# Patient Record
Sex: Female | Born: 1965 | Race: Black or African American | Hispanic: No | Marital: Married | Smoking: Former smoker
Health system: Southern US, Community
[De-identification: ages and names within clinical notes are randomized; demographics above are authoritative.]

## PROBLEM LIST (undated history)

## (undated) DIAGNOSIS — E669 Obesity, unspecified: Secondary | ICD-10-CM

## (undated) DIAGNOSIS — M1990 Unspecified osteoarthritis, unspecified site: Secondary | ICD-10-CM

## (undated) DIAGNOSIS — I10 Essential (primary) hypertension: Secondary | ICD-10-CM

## (undated) HISTORY — PX: UTERINE FIBROID SURGERY: SHX826

## (undated) HISTORY — PX: TUBAL LIGATION: SHX77

---

## 2008-07-01 ENCOUNTER — Emergency Department (HOSPITAL_COMMUNITY): Admission: EM | Admit: 2008-07-01 | Discharge: 2008-07-01 | Payer: Self-pay | Admitting: Emergency Medicine

## 2009-05-20 ENCOUNTER — Emergency Department (HOSPITAL_COMMUNITY): Admission: EM | Admit: 2009-05-20 | Discharge: 2009-05-20 | Payer: Self-pay | Admitting: Emergency Medicine

## 2009-07-04 ENCOUNTER — Emergency Department (HOSPITAL_COMMUNITY): Admission: EM | Admit: 2009-07-04 | Discharge: 2009-07-04 | Payer: Self-pay | Admitting: Emergency Medicine

## 2010-12-25 ENCOUNTER — Emergency Department (HOSPITAL_BASED_OUTPATIENT_CLINIC_OR_DEPARTMENT_OTHER)
Admission: EM | Admit: 2010-12-25 | Discharge: 2010-12-25 | Disposition: A | Discharge status: Home / Self Care | Payer: Self-pay | Attending: Emergency Medicine | Admitting: Emergency Medicine

## 2010-12-25 ENCOUNTER — Encounter: Payer: Self-pay | Admitting: *Deleted

## 2010-12-25 ENCOUNTER — Emergency Department (INDEPENDENT_AMBULATORY_CARE_PROVIDER_SITE_OTHER): Payer: Self-pay

## 2010-12-25 DIAGNOSIS — R0602 Shortness of breath: Secondary | ICD-10-CM

## 2010-12-25 DIAGNOSIS — R05 Cough: Secondary | ICD-10-CM

## 2010-12-25 DIAGNOSIS — R079 Chest pain, unspecified: Secondary | ICD-10-CM

## 2010-12-25 DIAGNOSIS — R059 Cough, unspecified: Secondary | ICD-10-CM | POA: Insufficient documentation

## 2010-12-25 HISTORY — DX: Essential (primary) hypertension: I10

## 2010-12-25 MED ORDER — HYDROCOD POLST-CHLORPHEN POLST 10-8 MG/5ML PO LQCR: 5.0000 mL | Freq: Two times a day (BID) | ORAL | Status: DC

## 2010-12-25 MED ORDER — AZITHROMYCIN 250 MG PO TABS: 250.0000 mg | ORAL_TABLET | Freq: Every day | ORAL | Status: AC

## 2010-12-25 NOTE — ED Provider Notes (Signed)
History     Chief Complaint  Patient presents with  . Shortness of Breath   Patient is a 45 y.o. female presenting with shortness of breath. The history is provided by the patient. No language interpreter was used.  Shortness of Breath  The current episode started more than 2 weeks ago. The onset was gradual. The problem occurs continuously. The problem has been gradually worsening. The problem is mild. The symptoms are relieved by nothing. The symptoms are aggravated by nothing. Associated symptoms include a fever, cough and shortness of breath. Pertinent negatives include no chest pain and no sore throat. She was not exposed to toxic fumes. She has had no prior steroid use. Her past medical history does not include asthma.    History reviewed. No pertinent past medical history.  Past Surgical History  Procedure Date  . Cesarean section   . Uterine fibroid surgery     History reviewed. No pertinent family history.  History  Substance Use Topics  . Smoking status: Former Games developer  . Smokeless tobacco: Not on file  . Alcohol Use: No    OB History    Grav Para Term Preterm Abortions TAB SAB Ect Mult Living                  Review of Systems  Constitutional: Positive for fever.       Pt states that she was started on a bp medication that was making her cough:pt states that she hasn't been on that medication or any bp medication even though she was given an new script for hctz  HENT: Negative for sore throat.   Respiratory: Positive for cough and shortness of breath.   Cardiovascular: Negative for chest pain.    Physical Exam  BP 149/77  Pulse 78  Temp(Src) 98.7 F (37.1 C) (Oral)  Resp 18  SpO2 97%  Physical Exam  Constitutional: She appears well-developed and well-nourished.  HENT:  Head: Normocephalic.  Neck: Normal range of motion. Neck supple.  Cardiovascular: Normal rate and regular rhythm.   Pulmonary/Chest: No accessory muscle usage. No apnea. No respiratory  distress.       Pt has a productive cough  Abdominal: Soft.  Musculoskeletal: Normal range of motion.  Neurological: She is alert.  Skin: Skin is warm.  Psychiatric: She has a normal mood and affect.    ED Course  Procedures  MDM Pt cough not related to medication as pt hasn't been on them in 3 weeks:pt is not having cp:pt has had fever and productive cough:will treat for bronchitis do to persistent symptoms:no edema noted on the exam      Teressa Lower, NP 12/25/10 1416  Evaluation and management were performed by NP/PA under my supervision/collaboration.  SHELDON,CHARLES B.   Charles B. Bernette Mayers, MD 12/25/10 1430

## 2010-12-25 NOTE — ED Notes (Signed)
Cough and sob for about 6 weeks. Was started on BP medication prior to cough.

## 2011-10-20 ENCOUNTER — Encounter (HOSPITAL_COMMUNITY): Payer: Self-pay | Admitting: *Deleted

## 2011-10-20 ENCOUNTER — Emergency Department (HOSPITAL_COMMUNITY)
Admission: EM | Admit: 2011-10-20 | Discharge: 2011-10-20 | Disposition: A | Discharge status: Home / Self Care | Payer: Self-pay | Source: Home / Self Care | Attending: Family Medicine | Admitting: Family Medicine

## 2011-10-20 DIAGNOSIS — I10 Essential (primary) hypertension: Secondary | ICD-10-CM

## 2011-10-20 LAB — POCT I-STAT, CHEM 8
Calcium, Ion: 1.21 mmol/L (ref 1.12–1.32)
Creatinine, Ser: 0.9 mg/dL (ref 0.50–1.10)
TCO2: 28 mmol/L (ref 0–100)

## 2011-10-20 MED ORDER — LISINOPRIL-HYDROCHLOROTHIAZIDE 20-12.5 MG PO TABS: 1.0000 | ORAL_TABLET | Freq: Every day | ORAL | Status: DC

## 2011-10-20 MED ORDER — AMLODIPINE BESYLATE 5 MG PO TABS: 5.0000 mg | ORAL_TABLET | Freq: Every day | ORAL | Status: DC

## 2011-10-20 NOTE — Discharge Instructions (Signed)
Go to www.goodrx.com to look up your medications. This will give you a list of where you can find your prescriptions at the most affordable prices.   RESOURCE GUIDE  Chronic Pain Problems Contact [facility]  [phone] Patients need to be referred by their primary care doctor.  Insufficient Money for Medicine Contact [employer]:  call "211" or [facility] [phone].  No Primary Care Doctor Call [facility]  [phone] Other agencies that provide inexpensive medical care    [facility]  [phone]    [facility]  [phone]    [facility]  [phone]    [facility]  [phone] [address] [place] [address]    [facility]  [phone]    [facility]  [phone] [facility] [phone]   [address] [place]. [address] [place], [place] [address]  [facility]  [phone] [facility]  [phone] [facility] Mental Health   [phone] (emergency services [phone])  Substance Abuse [facility]  [phone] [facility] [phone] [facility] [phone] Floydene Flock [phone] Residential & Outpatient Substance Abuse Program  [phone]  Abuse/Neglect [facility] Child Abuse Hotline [phone] [facility] Child Abuse Hotline 8471467610 (After Hours)  Emergency Shelter [facility] [phone]  M[facility] [place] [facility] [phone] [facility] [phone]  MRSA Hotline #:   7541949718                                                Cristobal Goldmann Phone:  [phone]                                   Phone:  [phone]                 Phone:  [phone]  [facility] Phone:  [phone]  [facility] Child Abuse Hotline [phone]    [facility] Resources  [facility]     [employer]                          [facility] [address] [place]                       [address]      [address] [place] [address]   [phone] (After Hours)

## 2011-10-20 NOTE — ED Provider Notes (Signed)
History     CSN: [id number]  Arrival date & time 10/20/11  4098   First MD Initiated Contact with Patient 10/20/11 [phone]      Chief Complaint  Patient presents with  . Dizziness  . Headache    (Consider location/radiation/quality/duration/timing/severity/associated sxs/prior treatment) Patient is a 46 y.o. female presenting with neurologic complaint. The history is provided by the patient.  Neurologic Problem The primary symptoms include headaches and dizziness. The symptoms began 3 to 5 days ago. The symptoms are unchanged.  The headache is not associated with neck stiffness.  Dizziness does not occur with tinnitus.  Additional symptoms do not include neck stiffness, pain, nystagmus, tinnitus or vertigo. Medical issues also include hypertension. Associated medical issues comments: no meds for 7mos..    Past Medical History  Diagnosis Date  . Hypertension     Past Surgical History  Procedure Date  . Cesarean section   . Uterine fibroid surgery     History reviewed. No pertinent family history.  History  Substance Use Topics  . Smoking status: Former Games developer  . Smokeless tobacco: Not on file  . Alcohol Use: No    OB History    Grav Para Term Preterm Abortions TAB SAB Ect Mult Living                  Review of Systems  Constitutional: Negative.   HENT: Negative for congestion, rhinorrhea, neck stiffness, postnasal drip and tinnitus.   Respiratory: Negative for cough, chest tightness and shortness of breath.   Cardiovascular: Negative for chest pain, palpitations and leg swelling.  Neurological: Positive for dizziness and headaches. Negative for vertigo and syncope.    Allergies  Review of patient's allergies indicates no known allergies.  Home Medications   Current Outpatient Rx  Name Route Sig Dispense Refill  . AMLODIPINE BESYLATE 5 MG PO TABS Oral Take 1 tablet (5 mg total) by mouth daily. 30 tablet 1  . HYDROCOD POLST-CPM POLST ER 10-8 MG/5ML PO LQCR  Oral Take 5 mLs by mouth every 12 (twelve) hours. 140 mL 0  . LISINOPRIL-HYDROCHLOROTHIAZIDE 20-12.5 MG PO TABS Oral Take 1 tablet by mouth daily. 30 tablet 1    BP 184/77  Pulse 88  Temp(Src) 98.4 F (36.9 C) (Oral)  Resp 17  SpO2 96%  Physical Exam  Nursing note and vitals reviewed. Constitutional: She is oriented to person, place, and time. She appears well-developed and well-nourished.  HENT:  Head: Normocephalic.  Right Ear: Tympanic membrane, external ear and ear canal normal.  Left Ear: Tympanic membrane, external ear and ear canal normal.  Mouth/Throat: Oropharynx is clear and moist.  Eyes: Conjunctivae and EOM are normal. Pupils are equal, round, and reactive to light.  Neck: Normal range of motion. Neck supple.  Cardiovascular: Normal rate, regular rhythm, normal heart sounds and intact distal pulses.   Pulmonary/Chest: Effort normal and breath sounds normal.  Lymphadenopathy:    She has no cervical adenopathy.  Neurological: She is alert and oriented to person, place, and time.  Skin: Skin is warm and dry.    ED Course  Procedures (including critical care time)  Labs Reviewed  POCT I-STAT, CHEM 8 - Abnormal; Notable for the following:    Glucose, Bld 132 (*)    All other components within normal limits   No results found.   1. Hypertension, isolated systolic       MDM          Linna Hoff, MD 10/20/11 1101

## 2011-10-20 NOTE — ED Notes (Addendum)
Pt with c/o intermittent headache and dizziness x one week - per pt ran out of hypertension medication x months - room spinning when dizziness occurs - unknown hypertension meds - denies chest pain or weakness

## 2011-11-18 IMAGING — CR DG CHEST 2V
2 series · 2 of 2 positions shown · non-contrast
Comparison: Thoracic spine radiographs 07/04/2009.

CLINICAL DATA: Cough, shortness of breath and chest pain.  History
of hypertension.

CHEST - 2 VIEW

[w chest pa]
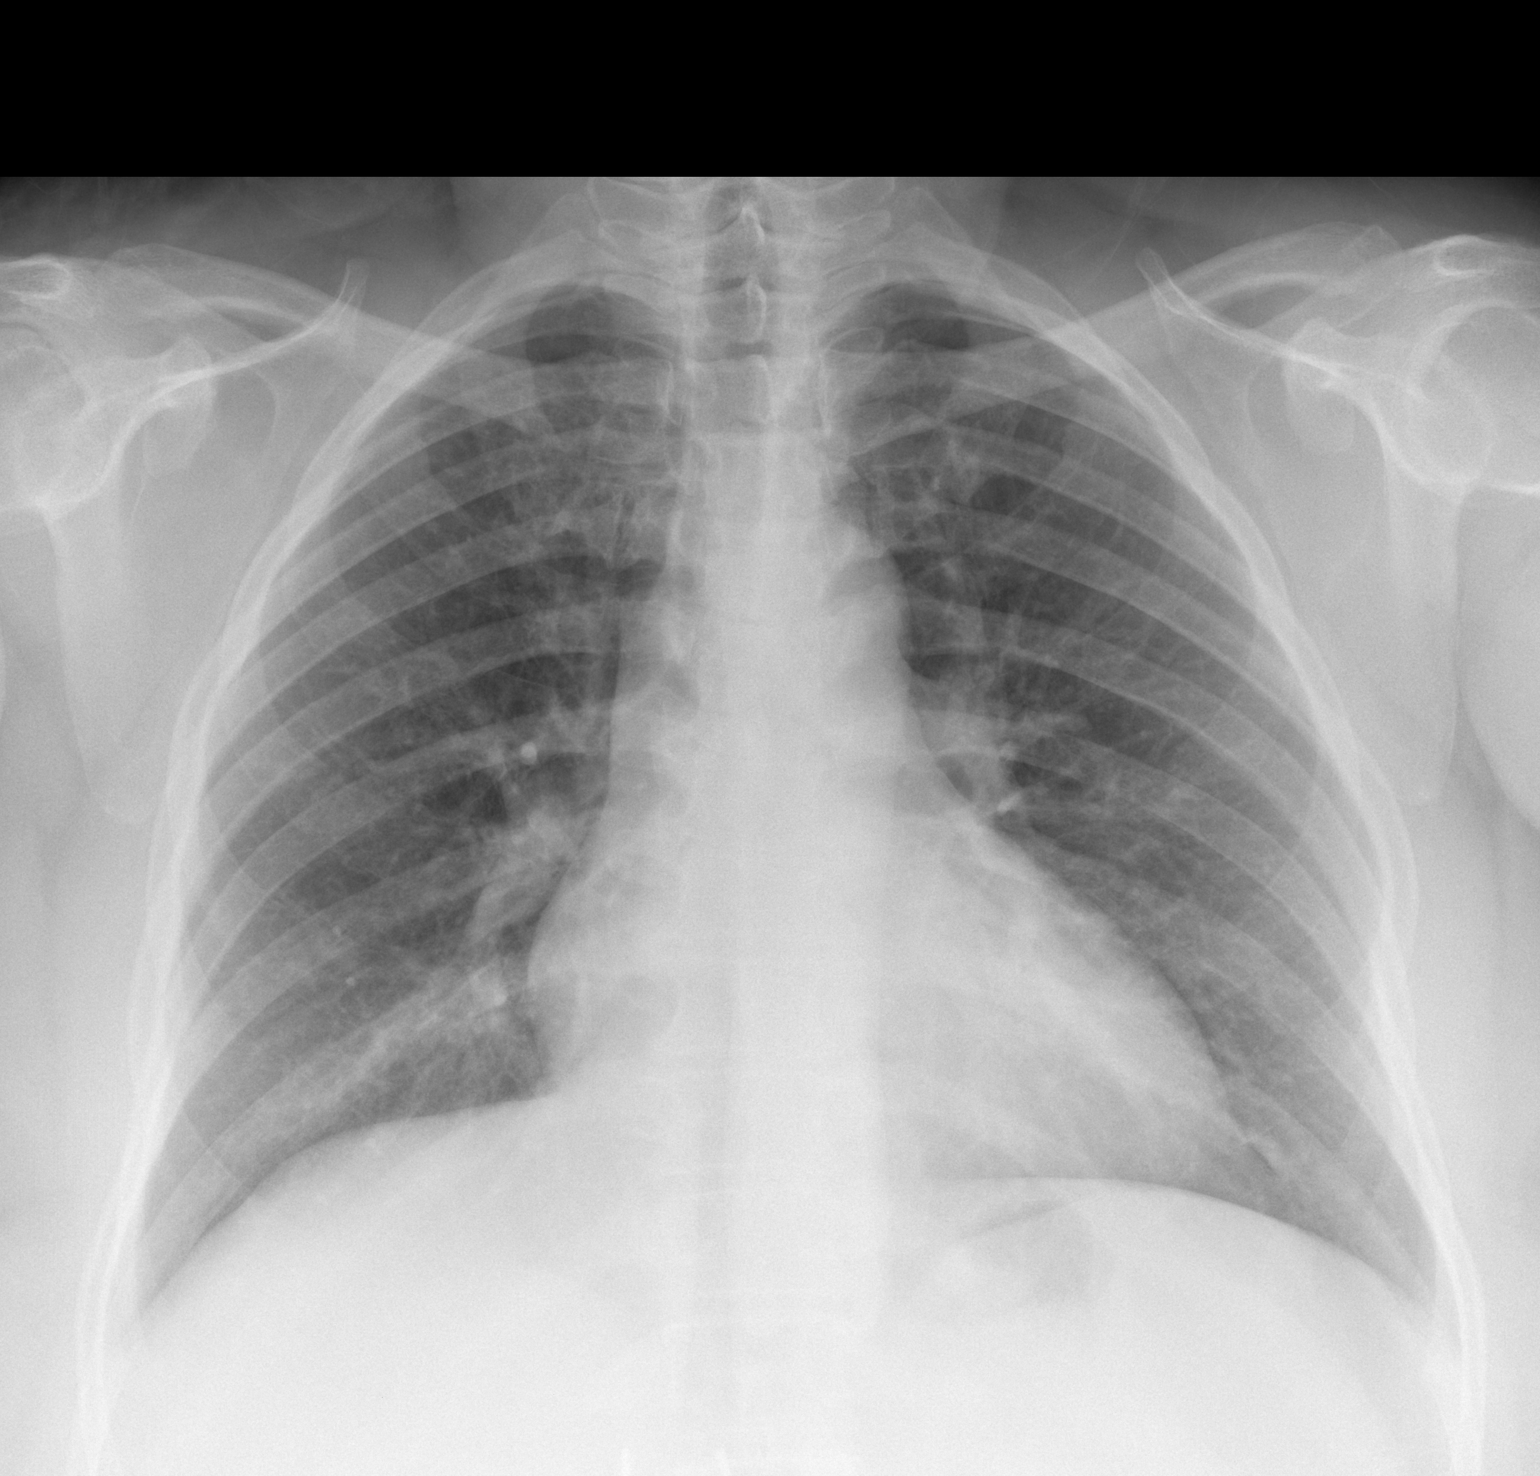

[w chest lat]
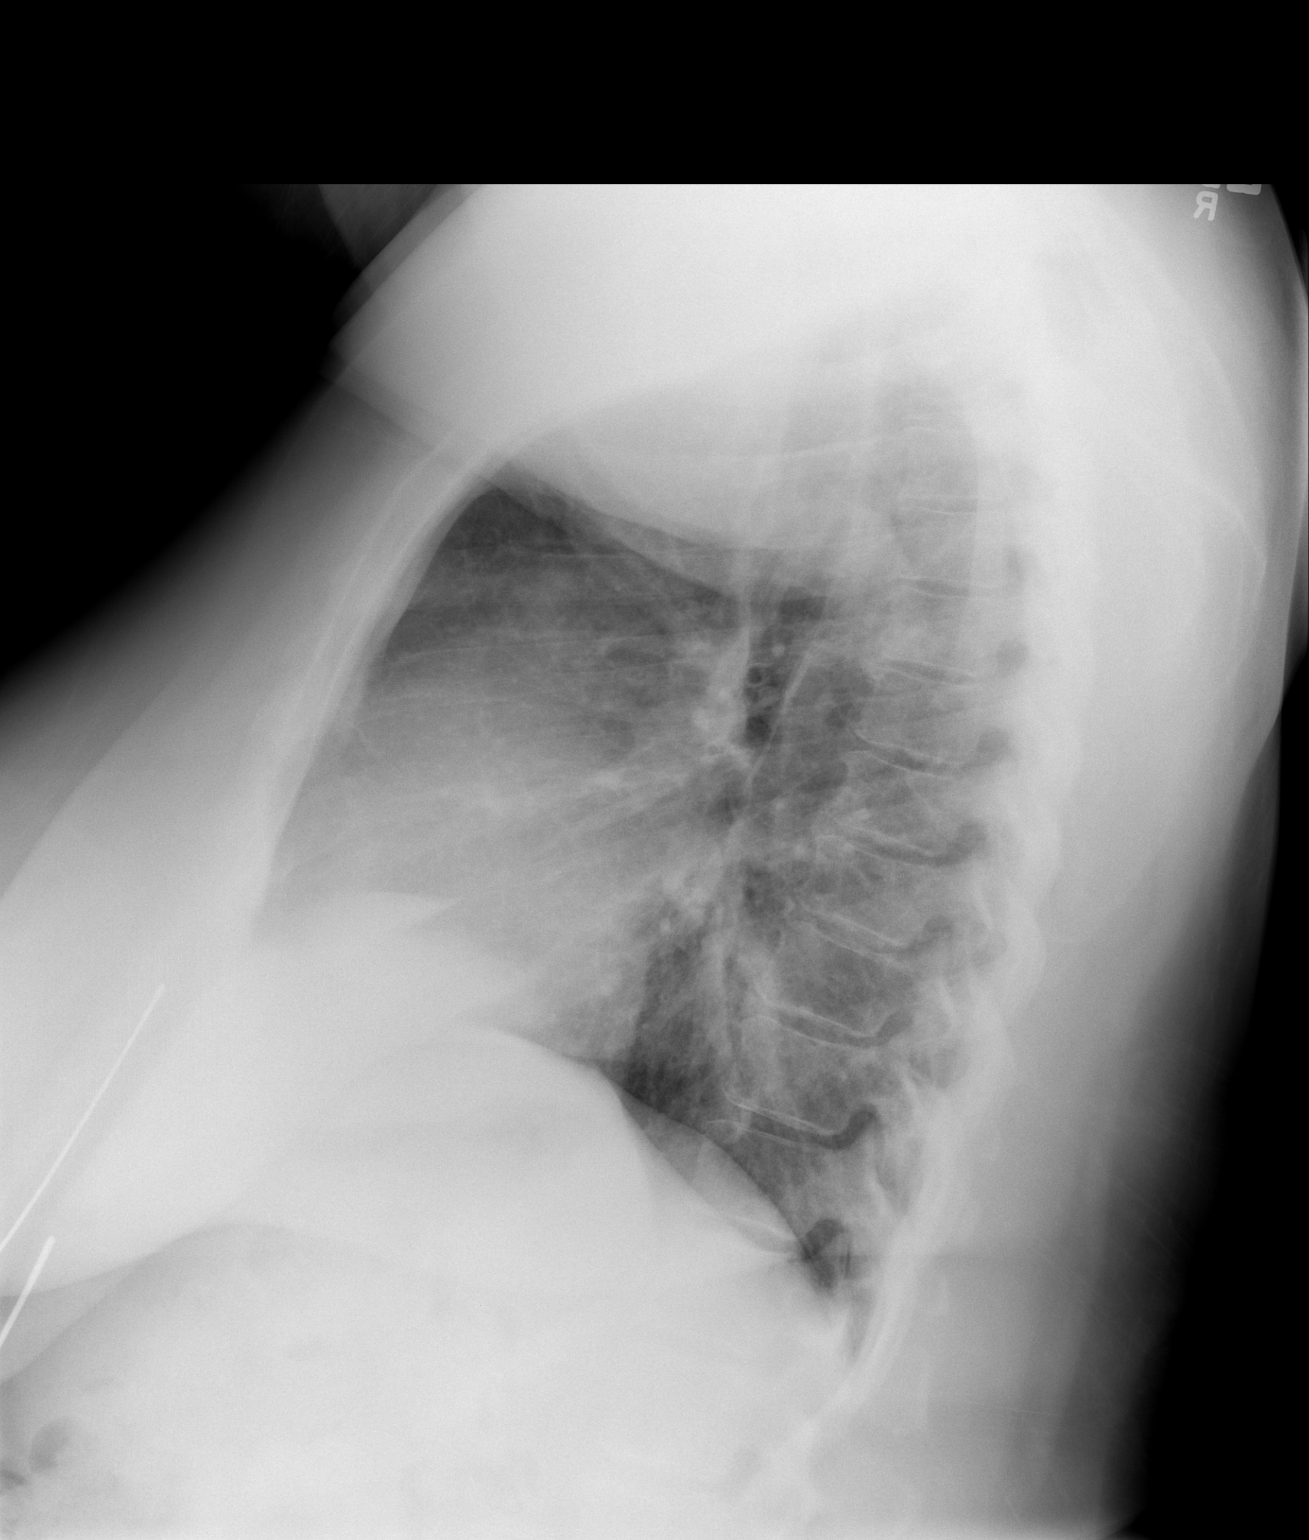

[2 of 2 positions shown; findings below may reference images not displayed]

FINDINGS: The heart size and mediastinal contours are stable.  The
lungs are clear.  There is no pleural effusion or pneumothorax.  No
acute osseous findings are demonstrated.
IMPRESSION: No active cardiopulmonary process.

## 2012-10-23 ENCOUNTER — Emergency Department (HOSPITAL_BASED_OUTPATIENT_CLINIC_OR_DEPARTMENT_OTHER): Payer: Self-pay

## 2012-10-23 ENCOUNTER — Emergency Department (HOSPITAL_BASED_OUTPATIENT_CLINIC_OR_DEPARTMENT_OTHER)
Admission: EM | Admit: 2012-10-23 | Discharge: 2012-10-23 | Disposition: A | Discharge status: Home / Self Care | Payer: Self-pay | Attending: Emergency Medicine | Admitting: Emergency Medicine

## 2012-10-23 ENCOUNTER — Encounter (HOSPITAL_BASED_OUTPATIENT_CLINIC_OR_DEPARTMENT_OTHER): Payer: Self-pay | Admitting: *Deleted

## 2012-10-23 DIAGNOSIS — R059 Cough, unspecified: Secondary | ICD-10-CM | POA: Insufficient documentation

## 2012-10-23 DIAGNOSIS — J40 Bronchitis, not specified as acute or chronic: Secondary | ICD-10-CM

## 2012-10-23 DIAGNOSIS — R52 Pain, unspecified: Secondary | ICD-10-CM | POA: Insufficient documentation

## 2012-10-23 DIAGNOSIS — R05 Cough: Secondary | ICD-10-CM | POA: Insufficient documentation

## 2012-10-23 DIAGNOSIS — J209 Acute bronchitis, unspecified: Secondary | ICD-10-CM | POA: Insufficient documentation

## 2012-10-23 DIAGNOSIS — I10 Essential (primary) hypertension: Secondary | ICD-10-CM | POA: Insufficient documentation

## 2012-10-23 DIAGNOSIS — Z79899 Other long term (current) drug therapy: Secondary | ICD-10-CM | POA: Insufficient documentation

## 2012-10-23 DIAGNOSIS — Z87891 Personal history of nicotine dependence: Secondary | ICD-10-CM | POA: Insufficient documentation

## 2012-10-23 LAB — RAPID STREP SCREEN (MED CTR MEBANE ONLY): Streptococcus, Group A Screen (Direct): NEGATIVE

## 2012-10-23 MED ORDER — AZITHROMYCIN 250 MG PO TABS: ORAL_TABLET | ORAL | Status: DC

## 2012-10-23 NOTE — ED Notes (Signed)
Pt c/o sore throat and bodyaches x 3 days

## 2012-10-23 NOTE — ED Provider Notes (Signed)
History     CSN: [id number]  Arrival date & time 10/23/12  1403   First MD Initiated Contact with Patient 10/23/12 1447      Chief Complaint  Patient presents with  . Sore Throat  . Generalized Body Aches    (Consider location/radiation/quality/duration/timing/severity/associated sxs/prior treatment) Patient is a 47 y.o. female presenting with cough. The history is provided by the patient. No language interpreter was used.  Cough Cough characteristics:  Productive Sputum characteristics:  Clear Severity:  Moderate Onset quality:  Gradual Duration:  3 days Timing:  Constant Progression:  Worsening Chronicity:  Recurrent Relieved by:  Nothing Worsened by:  Nothing tried   Past Medical History  Diagnosis Date  . Hypertension     Past Surgical History  Procedure Laterality Date  . Cesarean section    . Uterine fibroid surgery      History reviewed. No pertinent family history.  History  Substance Use Topics  . Smoking status: Former Games developer  . Smokeless tobacco: Not on file  . Alcohol Use: No    OB History   Grav Para Term Preterm Abortions TAB SAB Ect Mult Living                  Review of Systems  Respiratory: Positive for cough.   All other systems reviewed and are negative.    Allergies  Review of patient's allergies indicates no known allergies.  Home Medications   Current Outpatient Rx  Name  Route  Sig  Dispense  Refill  . chlorthalidone (HYGROTON) 25 MG tablet   Oral   Take 25 mg by mouth daily.         Marland Kitchen EXPIRED: amLODipine (NORVASC) 5 MG tablet   Oral   Take 10 mg by mouth daily.         . chlorpheniramine-HYDROcodone (TUSSIONEX PENNKINETIC ER) 10-8 MG/5ML LQCR   Oral   Take 5 mLs by mouth every 12 (twelve) hours.   140 mL   0   . EXPIRED: lisinopril-hydrochlorothiazide (PRINZIDE,ZESTORETIC) 20-12.5 MG per tablet   Oral   Take 1 tablet by mouth daily.   30 tablet   1     BP 129/94  Pulse 85  Temp(Src) 99.6 F (37.6  C) (Oral)  Resp 18  Ht 5\' 7"  (1.702 m)  Wt 335 lb (151.955 kg)  BMI 52.46 kg/m2  SpO2 98%  LMP 09/26/2012  Physical Exam  Nursing note and vitals reviewed. Constitutional: She appears well-developed and well-nourished.  HENT:  Head: Normocephalic.  Right Ear: External ear normal.  Left Ear: External ear normal.  Nose: Nose normal.  Eyes: Conjunctivae are normal. Pupils are equal, round, and reactive to light.  Neck: Normal range of motion.  Cardiovascular: Normal rate.   Pulmonary/Chest: Effort normal.  Abdominal: Soft.  Musculoskeletal: Normal range of motion.  Neurological: She is alert.  Skin: Skin is warm.  Psychiatric: She has a normal mood and affect.    ED Course  Procedures (including critical care time)  Labs Reviewed - No data to display No results found.   1. Bronchitis       MDM  Chest xray no pneumonia, Pt given rx for zithromax.   Pt advised follow up with her primary for recheck next week      Elson Areas, PA-C 10/23/12 [address] [place], [place] 10/23/12 [phone]

## 2012-10-23 NOTE — ED Provider Notes (Signed)
Medical screening examination/treatment/procedure(s) were performed by non-physician practitioner and as supervising physician I was immediately available for consultation/collaboration.   Brena Windsor J. Deanna Wiater, MD 10/23/12 1745 

## 2012-10-26 NOTE — ED Notes (Signed)
Patient called and stated she is continuing to have intermittent fever and weakness.  States she does not have any insurance and needs to stay home from work.  Reviewed discharge info, encouraged to increase her fluids and vitamin C and to take tylenol for her fever every four hours as needed and at least two times a day to help with bodyaches.  Will give a work note to rest today and tomorrow.  Encouraged to come in for reevaluation if conditions worsened or needs extention of work note.

## 2013-03-23 ENCOUNTER — Encounter (HOSPITAL_BASED_OUTPATIENT_CLINIC_OR_DEPARTMENT_OTHER): Payer: Self-pay | Admitting: *Deleted

## 2013-03-23 ENCOUNTER — Emergency Department (HOSPITAL_BASED_OUTPATIENT_CLINIC_OR_DEPARTMENT_OTHER)
Admission: EM | Admit: 2013-03-23 | Discharge: 2013-03-23 | Disposition: A | Discharge status: Home / Self Care | Payer: Self-pay | Attending: Emergency Medicine | Admitting: Emergency Medicine

## 2013-03-23 DIAGNOSIS — I10 Essential (primary) hypertension: Secondary | ICD-10-CM | POA: Insufficient documentation

## 2013-03-23 DIAGNOSIS — Z79899 Other long term (current) drug therapy: Secondary | ICD-10-CM | POA: Insufficient documentation

## 2013-03-23 DIAGNOSIS — E669 Obesity, unspecified: Secondary | ICD-10-CM | POA: Insufficient documentation

## 2013-03-23 DIAGNOSIS — Z8739 Personal history of other diseases of the musculoskeletal system and connective tissue: Secondary | ICD-10-CM | POA: Insufficient documentation

## 2013-03-23 DIAGNOSIS — M7662 Achilles tendinitis, left leg: Secondary | ICD-10-CM

## 2013-03-23 DIAGNOSIS — Z87891 Personal history of nicotine dependence: Secondary | ICD-10-CM | POA: Insufficient documentation

## 2013-03-23 DIAGNOSIS — M25569 Pain in unspecified knee: Secondary | ICD-10-CM | POA: Insufficient documentation

## 2013-03-23 DIAGNOSIS — M7660 Achilles tendinitis, unspecified leg: Secondary | ICD-10-CM | POA: Insufficient documentation

## 2013-03-23 DIAGNOSIS — M25561 Pain in right knee: Secondary | ICD-10-CM

## 2013-03-23 HISTORY — DX: Unspecified osteoarthritis, unspecified site: M19.90

## 2013-03-23 HISTORY — DX: Obesity, unspecified: E66.9

## 2013-03-23 MED ORDER — PREDNISONE 10 MG PO TABS: 20.0000 mg | ORAL_TABLET | Freq: Two times a day (BID) | ORAL | Status: DC

## 2013-03-23 MED ORDER — TRAMADOL HCL 50 MG PO TABS: 50.0000 mg | ORAL_TABLET | Freq: Four times a day (QID) | ORAL | Status: DC | PRN

## 2013-03-23 NOTE — ED Notes (Signed)
C/o bilateral knee pain x several weeks w leg burning,  Pain to back of left ankle

## 2013-03-23 NOTE — ED Provider Notes (Signed)
CSN: [id number]     Arrival date & time 03/23/13  4098 History   First MD Initiated Contact with Patient 03/23/13 1010     Chief Complaint  Patient presents with  . bilateral knee pain   . Ankle Pain   (Consider location/radiation/quality/duration/timing/severity/associated sxs/prior Treatment) HPI Comments: Patient is a 47 year old female with history of obesity and hypertension. She presents with complaints of bilateral knee pain she tells me has been ongoing for 2 years. She is seen a physician in the past and was told she likely had osteoarthritis. She has been dealing with this off and on for this time. Over the past several weeks her pain has worsened. She is also having burning discomfort in her left Achilles tendon just above her left heel. She denies any new injuries or trauma. She is able to walk however with discomfort. There no fevers and no chills.  Patient is a 47 y.o. female presenting with knee pain. The history is provided by the patient.  Knee Pain Location:  Knee Injury: no   Knee location:  L knee and R knee Pain details:    Quality:  Aching   Radiates to:  Does not radiate   Severity:  Moderate   Onset quality:  Gradual   Duration: Two-year.   Timing:  Constant   Progression:  Worsening   Past Medical History  Diagnosis Date  . Hypertension   . Obese   . Arthritis    Past Surgical History  Procedure Laterality Date  . Cesarean section    . Uterine fibroid surgery     History reviewed. No pertinent family history. History  Substance Use Topics  . Smoking status: Former Games developer  . Smokeless tobacco: Not on file  . Alcohol Use: No   OB History   Grav Para Term Preterm Abortions TAB SAB Ect Mult Living                 Review of Systems  All other systems reviewed and are negative.    Allergies  Review of patient's allergies indicates no known allergies.  Home Medications   Current Outpatient Rx  Name  Route  Sig  Dispense  Refill  .  EXPIRED: amLODipine (NORVASC) 5 MG tablet   Oral   Take 10 mg by mouth daily.         Marland Kitchen azithromycin (ZITHROMAX Z-PAK) 250 MG tablet      Two tablets 1st day then one tablet a day days 2-5   6 tablet   0   . chlorpheniramine-HYDROcodone (TUSSIONEX PENNKINETIC ER) 10-8 MG/5ML LQCR   Oral   Take 5 mLs by mouth every 12 (twelve) hours.   140 mL   0   . chlorthalidone (HYGROTON) 25 MG tablet   Oral   Take 25 mg by mouth daily.         Marland Kitchen EXPIRED: lisinopril-hydrochlorothiazide (PRINZIDE,ZESTORETIC) 20-12.5 MG per tablet   Oral   Take 1 tablet by mouth daily.   30 tablet   1    BP 164/100  Pulse 85  Temp(Src) 98.4 F (36.9 C) (Oral)  Resp 20  Ht 5\' 7"  (1.702 m)  Wt 332 lb (150.594 kg)  BMI 51.99 kg/m2  SpO2 97% Physical Exam  Nursing note and vitals reviewed. Constitutional: She is oriented to person, place, and time. She appears well-developed and well-nourished. No distress.  HENT:  Head: Normocephalic and atraumatic.  Mouth/Throat: Oropharynx is clear and moist.  Neck: Normal range of motion.  Neck supple.  Musculoskeletal:  Bilateral knees appear grossly normal. There is no effusion. There is good range of motion without crepitus. Anterior and posterior drawer tests are negative. There is no laxity with varus or valgus stress.  There is tenderness to palpation over the left Achilles tendon just above the calcaneus. Thompson test is negative. There is no calf tenderness and no palpable cord in the popliteal fossa  Neurological: She is alert and oriented to person, place, and time.  Skin: Skin is warm and dry. She is not diaphoretic.    ED Course  Procedures (including critical care time) Labs Review Labs Reviewed - No data to display Imaging Review No results found.  MDM  No diagnosis found. The pain in the knees appears to be related to osteoarthritis. This has been ongoing for 2 years I do not feel as though acute workup is indicated at this. She will be  treated with prednisone as an anti-inflammatory and pain medication. I have advised her this is not improving in the next week she should discuss referral to orthopedic surgeon with her primary care doctor. She also appears to have an Achilles tendinitis. There is no calf tenderness and Homans sign is absent. I have very little suspicion for DVT do not feel as though further studies into this are indicated at this time.    Geoffery Lyons, MD 03/23/13 1023

## 2013-09-16 IMAGING — CR DG CHEST 2V
2 series · 2 of 2 positions shown · non-contrast
Comparison: 12/25/2010

CLINICAL DATA: Cough

CHEST - 2 VIEW

[w chest pa]
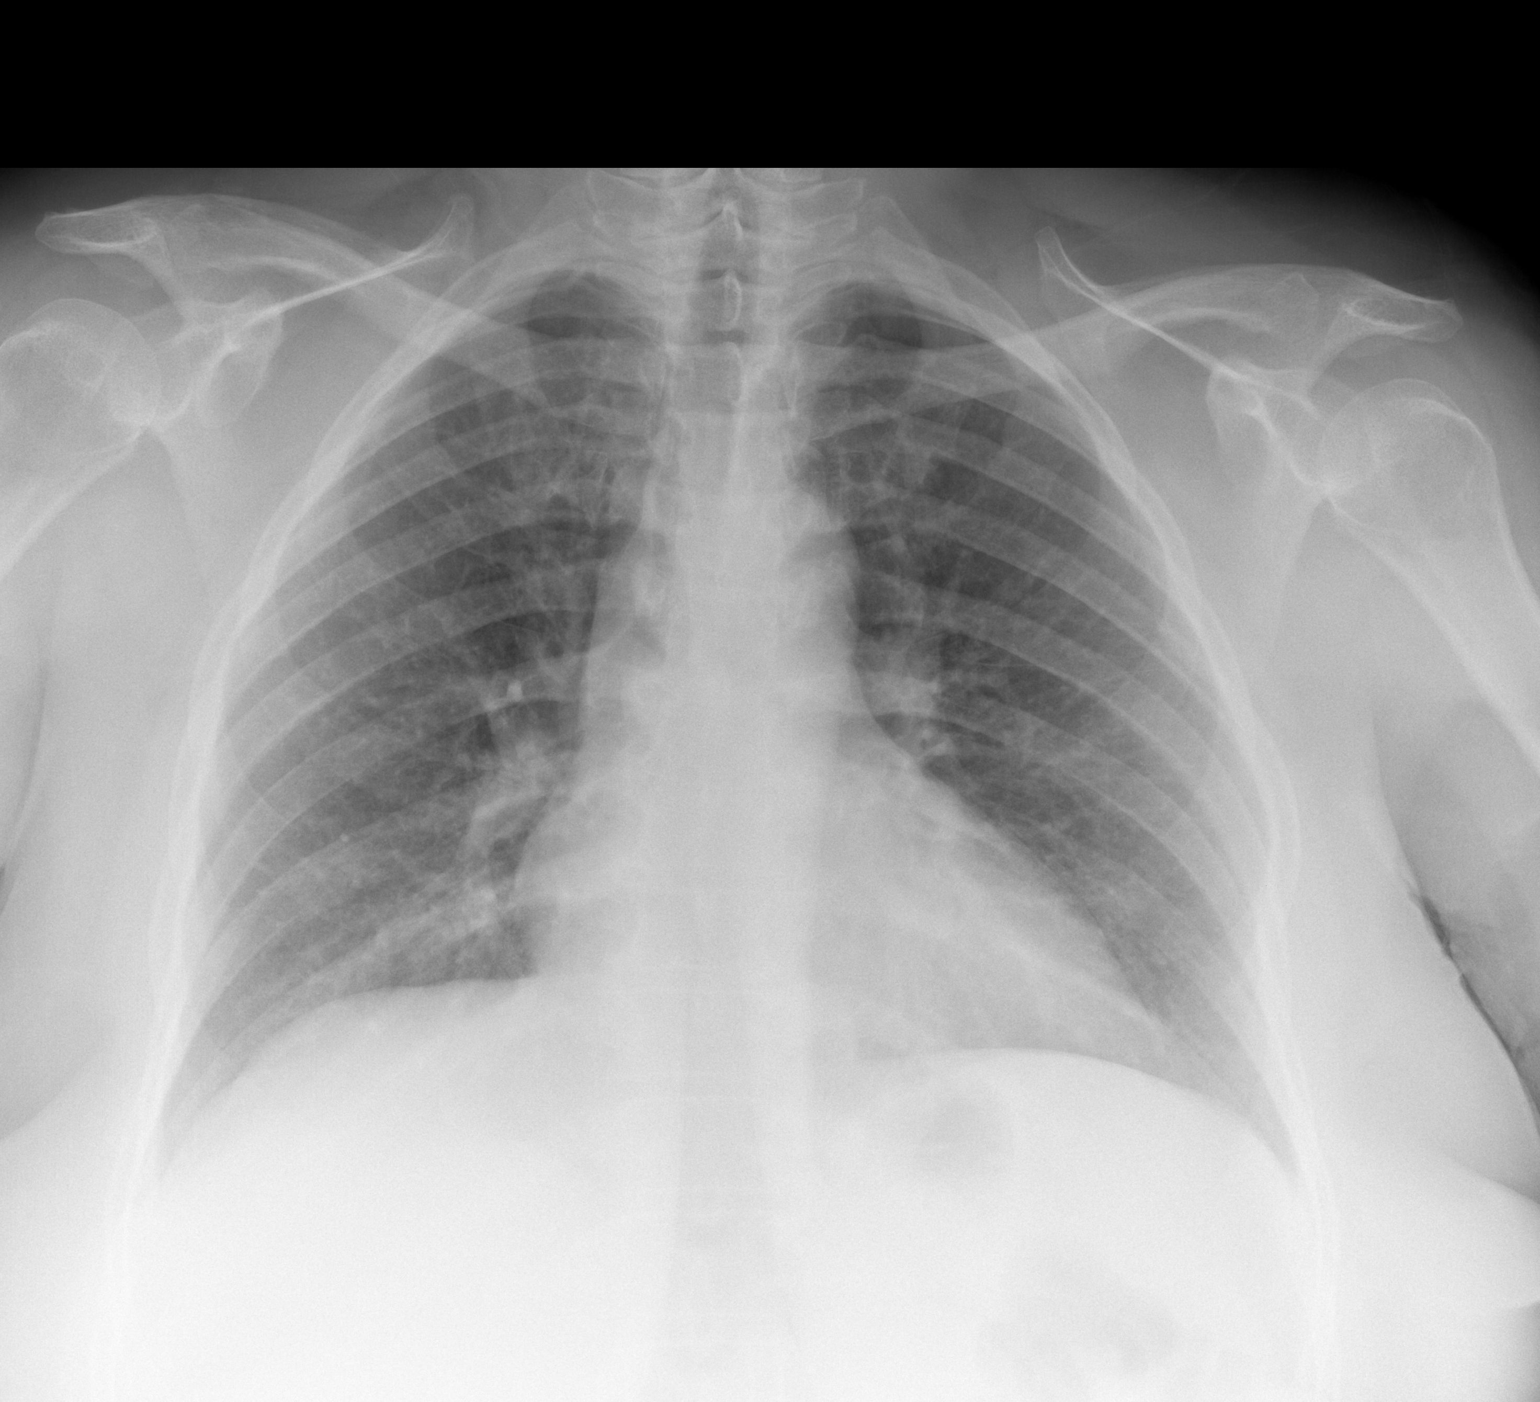

[w chest lat]
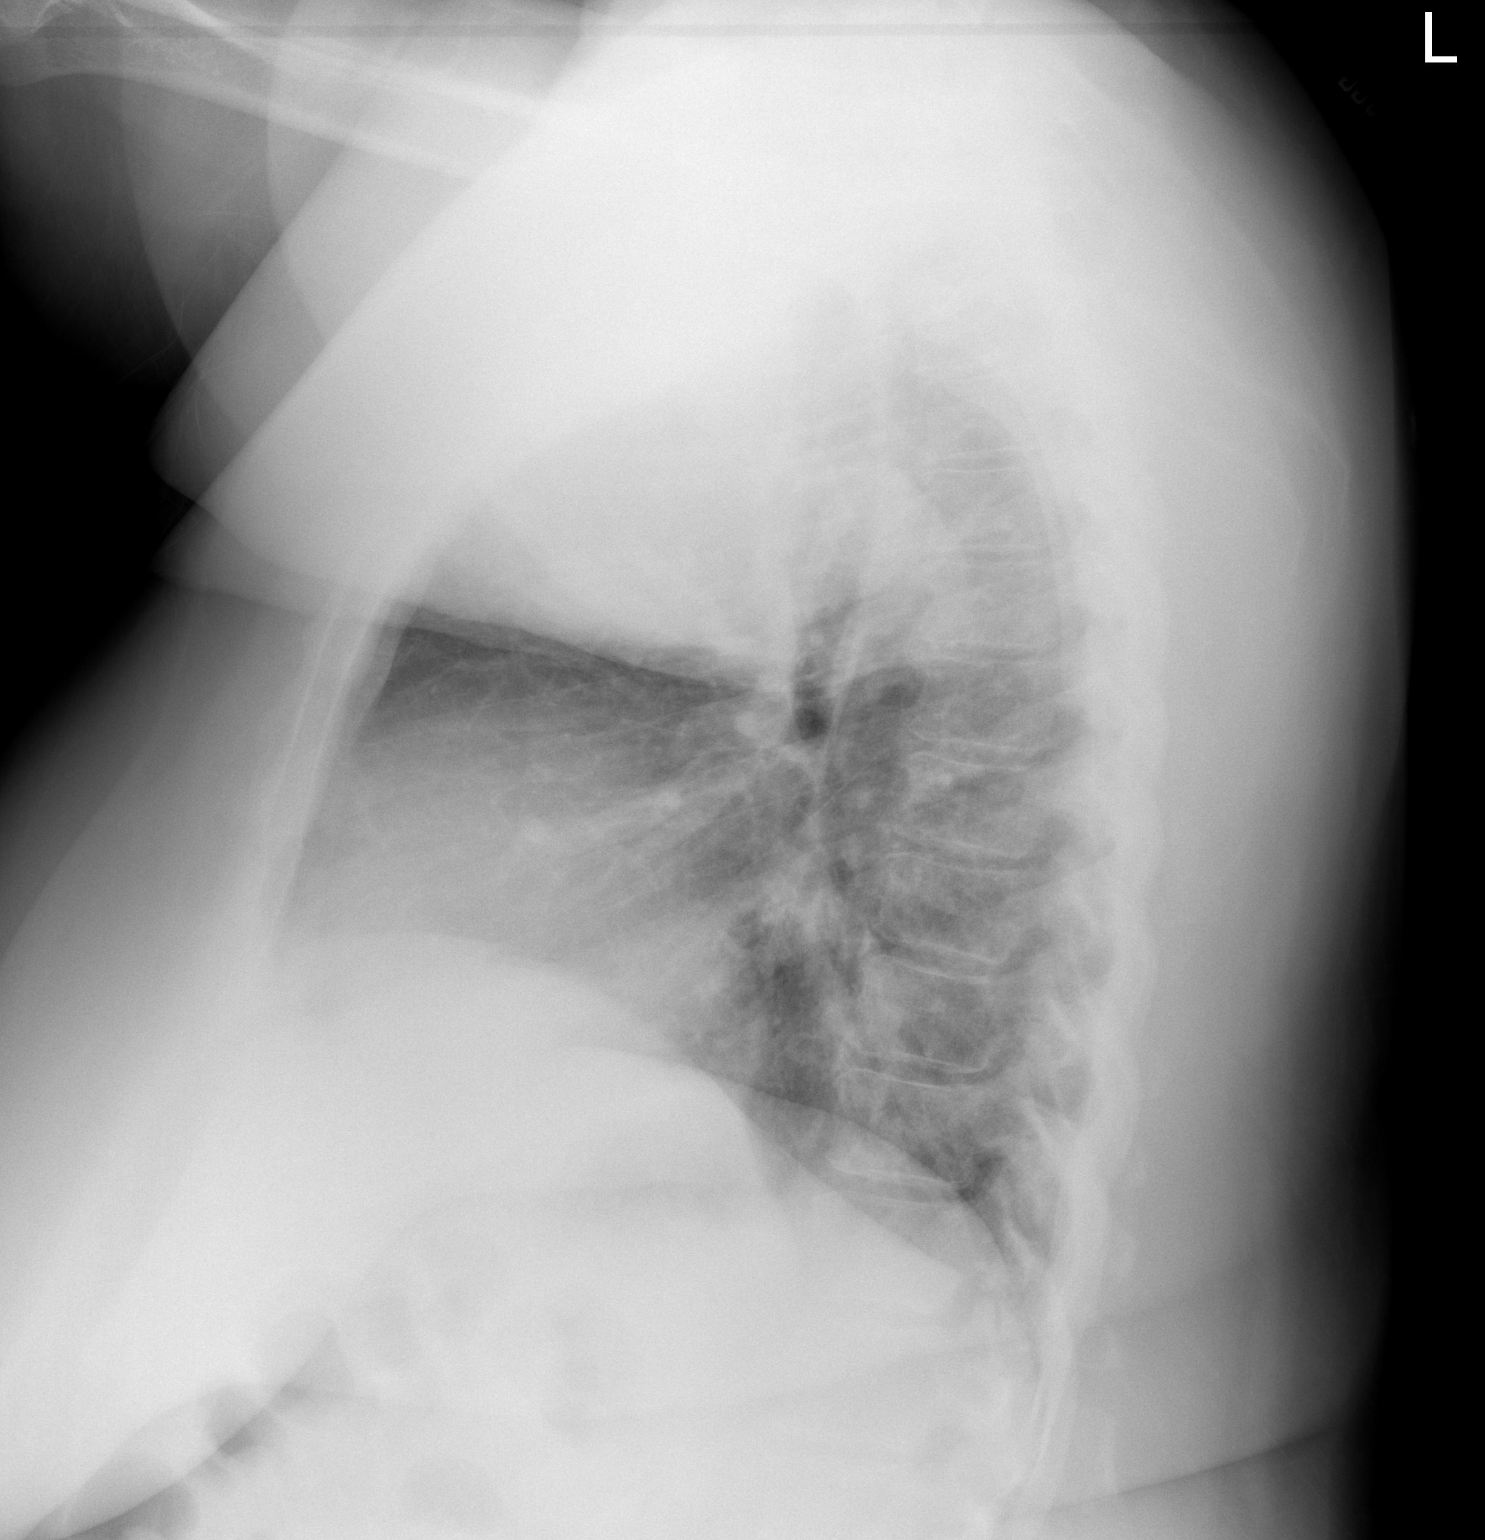

[2 of 2 positions shown; findings below may reference images not displayed]

FINDINGS: Cardiomediastinal silhouette is stable.  No acute
infiltrate or pleural effusion.  No pulmonary edema.  Bony thorax
is unremarkable.
IMPRESSION: No active disease.  No significant change.

## 2013-12-19 ENCOUNTER — Emergency Department (HOSPITAL_BASED_OUTPATIENT_CLINIC_OR_DEPARTMENT_OTHER)
Admission: EM | Admit: 2013-12-19 | Discharge: 2013-12-19 | Disposition: A | Discharge status: Home / Self Care | Payer: BC Managed Care – PPO | Attending: Emergency Medicine | Admitting: Emergency Medicine

## 2013-12-19 ENCOUNTER — Encounter (HOSPITAL_BASED_OUTPATIENT_CLINIC_OR_DEPARTMENT_OTHER): Payer: Self-pay | Admitting: Emergency Medicine

## 2013-12-19 DIAGNOSIS — I10 Essential (primary) hypertension: Secondary | ICD-10-CM | POA: Insufficient documentation

## 2013-12-19 DIAGNOSIS — Y9289 Other specified places as the place of occurrence of the external cause: Secondary | ICD-10-CM | POA: Insufficient documentation

## 2013-12-19 DIAGNOSIS — T169XXA Foreign body in ear, unspecified ear, initial encounter: Secondary | ICD-10-CM | POA: Insufficient documentation

## 2013-12-19 DIAGNOSIS — E669 Obesity, unspecified: Secondary | ICD-10-CM | POA: Insufficient documentation

## 2013-12-19 DIAGNOSIS — T161XXA Foreign body in right ear, initial encounter: Secondary | ICD-10-CM

## 2013-12-19 DIAGNOSIS — Z87891 Personal history of nicotine dependence: Secondary | ICD-10-CM | POA: Insufficient documentation

## 2013-12-19 DIAGNOSIS — IMO0002 Reserved for concepts with insufficient information to code with codable children: Secondary | ICD-10-CM | POA: Insufficient documentation

## 2013-12-19 DIAGNOSIS — Z79899 Other long term (current) drug therapy: Secondary | ICD-10-CM | POA: Insufficient documentation

## 2013-12-19 DIAGNOSIS — Y9389 Activity, other specified: Secondary | ICD-10-CM | POA: Insufficient documentation

## 2013-12-19 DIAGNOSIS — M129 Arthropathy, unspecified: Secondary | ICD-10-CM | POA: Insufficient documentation

## 2013-12-19 MED ORDER — DOCUSATE SODIUM 50 MG/5ML PO LIQD
ORAL | Status: AC
  Filled 2013-12-19: qty 10

## 2013-12-19 NOTE — ED Notes (Signed)
Pt sts she was scratching her right ear with a pencil and states the point broke off in her ear.

## 2013-12-19 NOTE — ED Provider Notes (Signed)
CSN: [id number]     Arrival date & time 12/19/13  1432 History   First MD Initiated Contact with Patient 12/19/13 1636     Chief Complaint  Patient presents with  . Foreign Body in Ear     (Consider location/radiation/quality/duration/timing/severity/associated sxs/prior Treatment) Patient is a 48 y.o. female presenting with foreign body in ear. The history is provided by the patient. No language interpreter was used.  Foreign Body in Ear This is a new problem. The current episode started today. The problem occurs constantly. The problem has been gradually worsening. Nothing aggravates the symptoms. She has tried nothing for the symptoms. The treatment provided mild relief.  Pt has a pencil lead in ear canal  Past Medical History  Diagnosis Date  . Hypertension   . Obese   . Arthritis    Past Surgical History  Procedure Laterality Date  . Cesarean section    . Uterine fibroid surgery     No family history on file. History  Substance Use Topics  . Smoking status: Former Games developermoker  . Smokeless tobacco: Not on file  . Alcohol Use: No   OB History   Grav Para Term Preterm Abortions TAB SAB Ect Mult Living                 Review of Systems  HENT: Positive for ear pain.   All other systems reviewed and are negative.     Allergies  Review of patient's allergies indicates no known allergies.  Home Medications   Prior to Admission medications   Medication Sig Start Date End Date Taking? Authorizing Provider  amLODipine (NORVASC) 5 MG tablet Take 10 mg by mouth daily. 10/20/11 10/19/12  Linna HoffJames D Kindl, MD  azithromycin (ZITHROMAX Z-PAK) 250 MG tablet Two tablets 1st day then one tablet a day days 2-5 10/23/12   Elson AreasLeslie K Kikue Gerhart, PA-C  chlorpheniramine-HYDROcodone [facility](TUSSIONEX PENNKINETIC ER) 10-8 MG/5ML LQCR Take 5 mLs by mouth every 12 (twelve) hours. 12/25/10   Teressa LowerVrinda Pickering, NP  chlorthalidone (HYGROTON) 25 MG tablet Take 25 mg by mouth daily.    Historical Provider, MD   lisinopril-hydrochlorothiazide (PRINZIDE,ZESTORETIC) 20-12.5 MG per tablet Take 1 tablet by mouth daily. 10/20/11 10/19/12  Linna HoffJames D Kindl, MD  predniSONE (DELTASONE) 10 MG tablet Take 2 tablets (20 mg total) by mouth 2 (two) times daily. 03/23/13   Geoffery Lyonsouglas Delo, MD  traMADol (ULTRAM) 50 MG tablet Take 1 tablet (50 mg total) by mouth every 6 (six) hours as needed for pain. 03/23/13   Geoffery Lyonsouglas Delo, MD   BP 165/89  Pulse 87  Temp(Src) 98.3 F (36.8 C) (Oral)  Resp 18  Ht 5\' 7"  (1.702 m)  Wt 351 lb (159.213 kg)  BMI 54.96 kg/m2  SpO2 96% Physical Exam  Constitutional: She is oriented to person, place, and time. She appears well-developed and well-nourished.  HENT:  Head: Normocephalic and atraumatic.  Wax right ear canal,     Eyes: Pupils are equal, round, and reactive to light.  Neck: Normal range of motion.  Musculoskeletal: Normal range of motion.  Neurological: She is alert and oriented to person, place, and time.  Skin: Skin is warm.  Psychiatric: She has a normal mood and affect.    ED Course  Procedures (including critical care time) Labs Review Labs Reviewed - No data to display  Imaging Review No results found.   EKG Interpretation None      MDM   Final diagnoses:  Foreign body in ear, right, initial encounter  Wax irrigatted and pencil tip washed from ear.    Lonia SkinnerLeslie K University GardensSofia, PA-C 12/19/13 1911

## 2013-12-19 NOTE — Discharge Instructions (Signed)
Ear Foreign Body °An ear foreign body is an object that is stuck in the ear. It is common for young children to put objects into the ear canal. These may include pebbles, beads, beans, and any other small objects which will fit. In adults, objects such as cotton swabs may become lodged in the ear canal. In all ages, the most common foreign bodies are insects that enter the ear canal.  °SYMPTOMS  °Foreign bodies may cause pain, buzzing or roaring sounds, hearing loss, and ear drainage.  °HOME CARE INSTRUCTIONS  °· Keep all follow-up appointments with your caregiver as told. °· Keep small objects out of reach of young children. Tell them not to put anything in their ears. °SEEK IMMEDIATE MEDICAL CARE IF:  °· You have bleeding from the ear. °· You have increased pain or swelling of the ear. °· You have reduced hearing. °· You have discharge coming from the ear. °· You have a fever. °· You have a headache. °MAKE SURE YOU:  °· Understand these instructions. °· Will watch your condition. °· Will get help right away if you are not doing well or get worse. °Document Released: 05/31/2000 Document Revised: 08/26/2011 Document Reviewed: 01/20/2008 °Exit[employer]® Patient Information ©2015 [employer]. This information is not intended to replace advice given to you by your health care provider. Make sure you discuss any questions you have with your health care provider. ° °

## 2013-12-20 NOTE — ED Provider Notes (Signed)
Medical screening examination/treatment/procedure(s) were performed by non-physician practitioner and as supervising physician I was immediately available for consultation/collaboration.  Megan E Docherty, MD 12/20/13 2033 

## 2014-05-16 ENCOUNTER — Ambulatory Visit: Payer: BC Managed Care – PPO | Attending: Orthopedic Surgery | Admitting: Rehabilitation

## 2014-06-01 ENCOUNTER — Ambulatory Visit: Payer: BC Managed Care – PPO | Attending: Orthopedic Surgery

## 2014-07-05 ENCOUNTER — Ambulatory Visit: Payer: Self-pay

## 2014-07-12 ENCOUNTER — Ambulatory Visit: Payer: Self-pay

## 2014-07-20 ENCOUNTER — Ambulatory Visit: Payer: BLUE CROSS/BLUE SHIELD

## 2014-07-20 ENCOUNTER — Ambulatory Visit (INDEPENDENT_AMBULATORY_CARE_PROVIDER_SITE_OTHER): Payer: BLUE CROSS/BLUE SHIELD

## 2014-07-20 ENCOUNTER — Ambulatory Visit: Payer: Self-pay

## 2014-07-20 VITALS — BP 169/87 | HR 81 | Resp 32 | Ht 67.0 in | Wt 346.0 lb

## 2014-07-20 DIAGNOSIS — I10 Essential (primary) hypertension: Secondary | ICD-10-CM | POA: Insufficient documentation

## 2014-07-20 DIAGNOSIS — M7661 Achilles tendinitis, right leg: Secondary | ICD-10-CM

## 2014-07-20 DIAGNOSIS — M7730 Calcaneal spur, unspecified foot: Secondary | ICD-10-CM

## 2014-07-20 DIAGNOSIS — R52 Pain, unspecified: Secondary | ICD-10-CM

## 2014-07-20 DIAGNOSIS — M6700 Short Achilles tendon (acquired), unspecified ankle: Secondary | ICD-10-CM

## 2014-07-20 DIAGNOSIS — M7662 Achilles tendinitis, left leg: Secondary | ICD-10-CM

## 2014-07-20 NOTE — Progress Notes (Signed)
Subjective:    Patient ID: Kathryn Holt, female    DOB: May 18, 1966, 49 y.o.   MRN: [id number]  Foot Pain This is a chronic problem. The current episode started more than 1 year ago. The problem occurs constantly. The problem has been gradually worsening. Associated symptoms include joint swelling and weakness. The symptoms are aggravated by standing, walking and bending. She has tried NSAIDs, immobilization and rest (Cortisone injections, orthotics) for the symptoms. The treatment provided no relief.   "It's my achilles on both feet.  It throbs aches, tender, sore and burns.  Dr. Elijah Birk recommended I come here.  He treated with Cortisone shots but it didn't help.  I have some inserts."    Review of Systems  Constitutional: Positive for activity change.  HENT: Negative.   Eyes: Negative.   Respiratory: Positive for shortness of breath.        Bronchitis  Cardiovascular: Positive for leg swelling.  Gastrointestinal: Negative.   Endocrine: Positive for cold intolerance.  Genitourinary: Positive for frequency.  Musculoskeletal: Positive for joint swelling.  Skin: Negative.   Allergic/Immunologic: Negative.   Neurological: Positive for weakness.  Hematological: Bruises/bleeds easily.  Psychiatric/Behavioral: Negative.        Objective:   Physical Exam 49 year old African-American female although developed well nourished considerably overweight presents at this time with a long-standing greater than 1 year x-ray several year history of recalcitrant and recurrent retrocalcaneal and heel pain. Patient most recently has had steroid injections anti-inflammatories has been in the immobilizer boot. Has been applying warm compresses and ice pack and NSAIDs oscillating steroids in the past. None of which provided any significant relief or improvement. Currently wearing a pair of flat-type boots or shoes which are likely inappropriate based on her weight and contracture of her tendon.  Objective  findings as follows vascular status is intact with pedal pulses palpable DP +2 over 4 PT +2 over 4 bilateral capillary refill time 3 seconds all digits of epicritic and proprioceptive sensations. Be intact and symmetric bilateral renal plantar response and DTRs patient has considerable hypersensitivity in the Achilles tendon insertion retrocalcaneal area and above the insertion site within the tendon itself. X-rays reveal well-developed retrocalcaneal spurs bilateral no intratendinous osteophytes are noted some thickening the tenderness at insertion repair insertion and lateral projection and oblique projections. Clinically no open wounds no ulcers no secondary infections no apparent rupture of the tendon is noted functionality is intact although exquisitely tender and any dorsiflexion of the foot weightbearing standing or activities. Also tender on direct palpation significant worse than right. Mild slight flexible digital contractures are noted otherwise rectus foot type was identified mild plus edema around the ankles noted.       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 There possibly retrocalcaneal bursitis. Patient having failed a variety of conservative measures was given an option for shockwave therapy however as shockwave is not covered by insurance and will be a complete out-of-pocket expense patient occasionally couldn't before that and instead is electing to consider her surgical option at this time. She is advised that surgery is not option however based on her weight and see and obesity there are some difficulties and risks certainly making it more difficult difficult nonweightbearing she will likely use a rollabout or a knee walker to get around rather than crutches or walker will be nonweightbearing for 4-6 weeks post surgery she'll be an air fracture boot or cam walker for at least  2-3 months postop  based on healing patient is advised that these to be 3 months before the contralateral foot can be addressed. Patient will be postoperative pain and antibiotic medication monitored and followed for a minimum of 3 months until healing has occurred. His advised it may take 3-6 months from swelling and tenderness to go down there will be some rehabilitation for the weakened tendon owing procedure may involve physical therapy and in the future as needed. At this time a consent form was reviewed and signed for retrocalcaneal spur resection as well as T no lysis cut repair and reattach Achilles tendon with bone anchor system. Risk of case alternatives were reviewed and all questions asked palpation are answered surgery rescheduled her convenience with appropriate follow-up thereafter. We'll consider a corkscrew anchor system or possibly a suture bridge system may be a sufficient to allow for lengthening. Again all questions asked palpation are answered and surgery scheduled  Darryl Lentichard Socorro DPM

## 2014-07-20 NOTE — Patient Instructions (Addendum)
Pre-Operative Instructions  Congratulations, you have decided to take an important step to improving your quality of life.  You can be assured that the doctors of [facility] will be with you every step of the way.  1. Plan to be at the surgery center/hospital at least 1 (one) hour prior to your scheduled time unless otherwise directed by the surgical center/hospital staff.  You must have a responsible adult accompany you, remain during the surgery and drive you home.  Make sure you have directions to the surgical center/hospital and know how to get there on time. 2. For hospital based surgery you will need to obtain a history and physical form from your family physician within 1 month prior to the date of surgery- we will give you a form for you primary physician.  3. We make every effort to accommodate the date you request for surgery.  There are however, times where surgery dates or times have to be moved.  We will contact you as soon as possible if a change in schedule is required.   4. No Aspirin/Ibuprofen for one week before surgery.  If you are on aspirin, any non-steroidal anti-inflammatory medications (Mobic, Aleve, Ibuprofen) you should stop taking it 7 days prior to your surgery.  You make take Tylenol  For pain prior to surgery.  5. Medications- If you are taking daily heart and blood pressure medications, seizure, reflux, allergy, asthma, anxiety, pain or diabetes medications, make sure the surgery center/hospital is aware before the day of surgery so they may notify you which medications to take or avoid the day of surgery. 6. No food or drink after midnight the night before surgery unless directed otherwise by surgical center/hospital staff. 7. No alcoholic beverages 24 hours prior to surgery.  No smoking 24 hours prior to or 24 hours after surgery. 8. Wear loose pants or shorts- loose enough to fit over bandages, boots, and casts. 9. No slip on shoes, sneakers are best. 10. Bring  your boot with you to the surgery center/hospital.  Also bring crutches or a walker if your physician has prescribed it for you.  If you do not have this equipment, it will be provided for you after surgery. 11. If you have not been contracted by the surgery center/hospital by the day before your surgery, call to confirm the date and time of your surgery. 12. Leave-time from work may vary depending on the type of surgery you have.  Appropriate arrangements should be made prior to surgery with your employer. 13. Prescriptions will be provided immediately following surgery by your doctor.  Have these filled as soon as possible after surgery and take the medication as directed. 14. Remove nail polish on the operative foot. 15. Wash the night before surgery.  The night before surgery wash the foot and leg well with the antibacterial soap provided and water paying special attention to beneath the toenails and in between the toes.  Rinse thoroughly with water and dry well with a towel.  Perform this wash unless told not to do so by your physician.  Enclosed: 1 Ice pack (please put in freezer the night before surgery)   1 Hibiclens skin cleaner   Pre-op Instructions  If you have any questions regarding the instructions, do not hesitate to call our office.  [place]: [place], [place] [address] [place]: [address], [place], [place] [address] [phone]  [place]: [place],  [address] [phone]  Dr. Delfino Lovett  Tuchman DPM, Dr. Cristie HemNorman Regal DPM Dr. Alvan Dameichard Grigor Lipschutz DPM, Dr. Ardelle AntonM. Todd Hyatt DPM, Dr. Marlowe AschoffKathryn Egerton DPM   given.     In the meantime wear a shoe with the elevated heel or a wedge. Avoid wearing flat shoes or flimsy shoes are going barefoot . Alternating applying warm compresses I expect to the back of heel to help alleviate some pain and symptoms. Recommend topical pain medications such as Salan Pas, or blue emu, these can be obtained at  any pharmacy in the pain or arthritis section

## 2014-08-10 ENCOUNTER — Telehealth: Payer: Self-pay | Admitting: *Deleted

## 2014-08-10 DIAGNOSIS — M65879 Other synovitis and tenosynovitis, unspecified ankle and foot: Secondary | ICD-10-CM | POA: Diagnosis not present

## 2014-08-10 DIAGNOSIS — M7732 Calcaneal spur, left foot: Secondary | ICD-10-CM | POA: Diagnosis not present

## 2014-08-10 NOTE — Telephone Encounter (Signed)
Patient had surgery today.  Dr. Ralene CorkSikora had offered to give her a prescription for a knee scooter but she declined because she said her son has one.  He may not have brought it.  So, she wants to go ahead and get a prescription just in case because she has to be non-weight bearing.  She wants to stop by on her way home to pick it up."  Okay, I'll have the prescription up front for her.  [facility] for a Roll-About (Knee Scooter).

## 2014-08-11 ENCOUNTER — Telehealth: Payer: Self-pay | Admitting: *Deleted

## 2014-08-11 NOTE — Telephone Encounter (Signed)
Pt states she would like to know what Cephalexin is, her husband is going to pick it up.  I informed pt it is an antibiotic.

## 2014-08-16 ENCOUNTER — Ambulatory Visit (INDEPENDENT_AMBULATORY_CARE_PROVIDER_SITE_OTHER): Payer: BLUE CROSS/BLUE SHIELD

## 2014-08-16 VITALS — BP 152/86 | HR 74 | Resp 12

## 2014-08-16 DIAGNOSIS — M6700 Short Achilles tendon (acquired), unspecified ankle: Secondary | ICD-10-CM

## 2014-08-16 DIAGNOSIS — M7730 Calcaneal spur, unspecified foot: Secondary | ICD-10-CM

## 2014-08-16 DIAGNOSIS — Z09 Encounter for follow-up examination after completed treatment for conditions other than malignant neoplasm: Secondary | ICD-10-CM

## 2014-08-16 NOTE — Progress Notes (Signed)
   Subjective:    Patient ID: Jobe GibbonKimalisa Korb, female    DOB: 26-Oct-1965, 49 y.o.   MRN: [id number]  HPI DOS 08/10/14 TENOLYSIS; CALCANEAL OSTEOTOMY LT. ''LT FOOT BACK IS DOING OK.''   Review of Systems no new findings or systemic changes     Objective:   Physical Exam Patient is 6 days status post retrocalcaneal spur resection and T no lysis Achilles tendon left patient has a BK cast intact is using rollabout and nonweightbearing on left foot no complaints of pain no discomfort intact sensation to toes noted x-rays through cast indicate adequate resection of bone no residual spurs intact skin staples no excessive edema no erythema no signs of infection no pain or discomfort in the foot leg her heel on the left foot. Patient still having issues with her Achilles tendon on her right is wearing the cam boot for a bleeding and protecting the Achilles tendon on the right foot during recovery.       Assessment & Plan:  Step progress on left foot recheck in 3-3 weeks for long-term follow-up likely remove cast and skin staples at next visit nonweightbearing until that time. Her Earl GalaGraff Ivin Rosenbloom DPM

## 2014-08-25 ENCOUNTER — Telehealth: Payer: Self-pay

## 2014-08-25 NOTE — Telephone Encounter (Signed)
Patient is already on oxycodone, not anything stronger that we can really give her. Recommend that she take 2 ibuprofen or Advil 3 times a day to help with the pain and inflammation and help reduce the possibility of blood clots DVT. Patient needs to keep the foot elevated level with or above the heart at all times. Maintain nonweightbearing as instructed  If she is running low on her Percocet we can have her come in and pick up a new refill prescription however this cannot be called and Silvadene Dailey would need to write a prescription and sign.  Again reminded to keep elevated use ice pack 2 or 3 times daily as instructed ibuprofen as well as the oxycodone  Alvan Dameichard Zahli Vetsch DPM

## 2014-08-25 NOTE — Telephone Encounter (Signed)
This pt called stating that the pain in her foot has returned and is not improving and wants to know if she can be prescribed a different medication for the pain.

## 2014-08-26 ENCOUNTER — Telehealth: Payer: Self-pay

## 2014-08-26 NOTE — Telephone Encounter (Signed)
Spoke with pt regarding post operative pain, advised her to ice behind the knee, elevate her foot, layer in ibuprofen ( 2 tabs) in between her Percocet doses. She stated that she was still able to wiggle her toes, they were pink in color and denied and numbness or tingling in her foot. Advised if symptoms persisted that she was to contact office or seek medical attention

## 2014-09-06 ENCOUNTER — Ambulatory Visit (INDEPENDENT_AMBULATORY_CARE_PROVIDER_SITE_OTHER): Payer: BLUE CROSS/BLUE SHIELD

## 2014-09-06 DIAGNOSIS — M6700 Short Achilles tendon (acquired), unspecified ankle: Secondary | ICD-10-CM

## 2014-09-06 DIAGNOSIS — Z09 Encounter for follow-up examination after completed treatment for conditions other than malignant neoplasm: Secondary | ICD-10-CM

## 2014-09-06 DIAGNOSIS — R609 Edema, unspecified: Secondary | ICD-10-CM

## 2014-09-06 DIAGNOSIS — M7730 Calcaneal spur, unspecified foot: Secondary | ICD-10-CM

## 2014-09-06 MED ORDER — OXYCODONE-ACETAMINOPHEN 10-325 MG PO TABS: 1.0000 | ORAL_TABLET | Freq: Four times a day (QID) | ORAL | Status: AC | PRN

## 2014-09-06 NOTE — Patient Instructions (Signed)
ICE INSTRUCTIONS  Apply ice or cold pack to the affected area at least 3 times a day for 10-15 minutes each time.  You should also use ice after prolonged activity or vigorous exercise.  Do not apply ice longer than 20 minutes at one time.  Always keep a cloth between your skin and the ice pack to prevent burns.  Being consistent and following these instructions will help control your symptoms.  We suggest you purchase a gel ice pack because they are reusable and do bit leak.  Some of them are designed to wrap around the area.  Use the method that works best for you.  Here are some other suggestions for icing.   Use a frozen bag of peas or corn-inexpensive and molds well to your body, usually stays frozen for 10 to 20 minutes.  Wet a towel with cold water and squeeze out the excess until it's damp.  Place in a bag in the freezer for 20 minutes. Then remove and use.  May resume normal bathing and hygiene wash with soap and water apply Neosporin cocoa butter to the incision areas maintain compression wrapping tell keep down the swelling and edema.  Need to maintain air fracture boot her Cam Walker at all times for ambulation and weightbearing do not walk without the boot in place.

## 2014-09-06 NOTE — Progress Notes (Signed)
   Subjective:    Patient ID: Kathryn Holt, female    DOB: September 25, 1965, 49 y.o.   MRN: [id number]  HPI  DOS 08/10/14 TENOLYSIS; CALCANEAL OSTEOTOMY LT. ''lt foot is still painful.''  Review of Systems no new findings or systemic changes noted     Objective:   Physical Exam Patient presents this time 4 weeks status post retrocalcaneal spur resection into the lysis Achilles tendon left foot. Patient is BK cast is removed at this time incision clean dry well coapted no dehiscence no discharge or drainage the skin staples are removed with slight discomfort at this time patient is having some persistent pain and shooting sensation and a slight numbness in the inferior heel area. Intact neurovascular status Achilles tendon is functioning well to dorsal flex plantar flexed the foot and ankle and flex the toes without restriction. At this time the cast having been removed patient will be placed in air fracture boot for mobilization to be continued.       Assessment & Plan:  Assessment good postop progress plan at this time cast is removed patient is placed in air fracture boot for Additional 4 weeks may resume weightbearing starting this week with the boot in place do not walk without the boot may resume normal bathing and hygiene wash and the foot apply Neosporin cocoa butter to the incision areas 4 weeks for follow-up and x-ray. Patient may remove the boot for bathing and exercising the movement of the foot and ankle however is not to ambulate or weight-bear without this in place a new prescription for Percocet 10 g/320 50 g position at this time for pain  Alvan Dameichard Pieter Fooks DPM

## 2014-09-14 ENCOUNTER — Telehealth: Payer: Self-pay | Admitting: Podiatry

## 2014-09-14 NOTE — Telephone Encounter (Signed)
Patient called the call service stating that there was a staple left after the removal. She is having pain. I recommended the patient come into the office on 09/14/14 in the morning and we will get her in to be seen and remove the staple. Call with any further questions/concerns.

## 2014-09-15 ENCOUNTER — Ambulatory Visit: Payer: BLUE CROSS/BLUE SHIELD | Admitting: Podiatry

## 2014-09-15 DIAGNOSIS — Z09 Encounter for follow-up examination after completed treatment for conditions other than malignant neoplasm: Secondary | ICD-10-CM

## 2014-09-15 NOTE — Progress Notes (Signed)
   Subjective:    Patient ID: Kathryn Holt, female    DOB: 12-19-1965, 49 y.o.   MRN: [id number]  HPI Comments: I removed a staple from the pt's left posterior ankle crease, after cleaning area with betadine. I covered the left posterior ankle staple site with neosporin bandaid after removal.     Review of Systems     Objective:   Physical Exam        Assessment & Plan:

## 2014-10-04 ENCOUNTER — Encounter: Payer: Self-pay | Admitting: Podiatry

## 2014-10-04 ENCOUNTER — Ambulatory Visit (INDEPENDENT_AMBULATORY_CARE_PROVIDER_SITE_OTHER): Payer: BLUE CROSS/BLUE SHIELD

## 2014-10-04 ENCOUNTER — Ambulatory Visit (INDEPENDENT_AMBULATORY_CARE_PROVIDER_SITE_OTHER): Payer: BLUE CROSS/BLUE SHIELD | Admitting: Podiatry

## 2014-10-04 VITALS — BP 135/72 | HR 86 | Resp 18

## 2014-10-04 DIAGNOSIS — M6700 Short Achilles tendon (acquired), unspecified ankle: Secondary | ICD-10-CM

## 2014-10-04 DIAGNOSIS — Z09 Encounter for follow-up examination after completed treatment for conditions other than malignant neoplasm: Secondary | ICD-10-CM

## 2014-10-05 NOTE — Progress Notes (Signed)
Patient ID: Kathryn Holt, female   DOB: Nov 23, 1965, 49 y.o.   MRN: [id number]  Subjective: 49 year old female presents the office today 8 weeks status post left Achilles tenolysis and calcaneal osteotomy. He states that she discontinue has some discomfort along area and the area is sensitive. She has continued weightbearing as tolerated in the [employer] Walker. She denies any systemic complaints as fevers, chills, nausea, vomiting. Denies any Pain, chest pain, soreness of breath.  Objective: AAO 3, NAD DP/PT pulses palpable, CRT less than 3 seconds Protective sensation intact with Simms Weinstein monofilament Incision along the posterior aspect of the left Achilles tendon is well coapted without any evidence of dehiscence. There is dry, scaly skin along the incision. There is no surrounding erythema, ascending cellulitis, malodor or other clinical signs of infection. There is mild tenderness overlying the Achilles tendon. There is no defect noted in the Jeffersonhompson test was performed and is negative. No open lesions or pre-ulcerative lesions. No pain with calf compression, swelling, warmth, erythema  Assessment: 49 year old female status post left Achilles tenolysis, calcaneal osteotomy performed on 08/10/2014 with Dr. Ralene CorkSikora  Plan: -X-rays were obtained and reviewed with the patient -Treatment options discussed include alternatives, risks, complications -At this time discussed the patient that she started transition out of the Cam Walker back into a regular she was tolerated. Dispensed heel lifts to the patient to help take pressure off the area. Also discussed stretching and rehabilitation exercises she can start gradually. If she has any increase in pain when transitioning she returns the [employer] Walker call the office. -Pain medication as needed -Ice and elevation -Follow up in 3 weeks or sooner if any problems are to arise. In the meantime call any questions or concerns.

## 2014-10-12 ENCOUNTER — Telehealth: Payer: Self-pay | Admitting: *Deleted

## 2014-10-12 MED ORDER — OXYCODONE-ACETAMINOPHEN 5-325 MG PO TABS: 1.0000 | ORAL_TABLET | Freq: Four times a day (QID) | ORAL | Status: AC | PRN

## 2014-10-12 NOTE — Telephone Encounter (Addendum)
Pt is requesting refill of Percocet.  Pt states she is having a lot of swelling in the sides of her foot about the size of a tennis ball and she has kept her boot on, but is walking the amount she would usually before the surgery.  I told pt she would have swelling on and off for 6-9 months, but pt states it's only on the sides.  I informed pt her pain medication could be picked up in the [place], and transferred to the schedulers for earlier appt.

## 2014-10-12 NOTE — Telephone Encounter (Signed)
Ok to refill. Thanks 

## 2014-10-13 ENCOUNTER — Ambulatory Visit (INDEPENDENT_AMBULATORY_CARE_PROVIDER_SITE_OTHER): Payer: BLUE CROSS/BLUE SHIELD

## 2014-10-13 ENCOUNTER — Ambulatory Visit (INDEPENDENT_AMBULATORY_CARE_PROVIDER_SITE_OTHER): Payer: BLUE CROSS/BLUE SHIELD | Admitting: Podiatry

## 2014-10-13 ENCOUNTER — Encounter: Payer: Self-pay | Admitting: Podiatry

## 2014-10-13 VITALS — BP 142/84 | HR 89 | Resp 12

## 2014-10-13 DIAGNOSIS — Z09 Encounter for follow-up examination after completed treatment for conditions other than malignant neoplasm: Secondary | ICD-10-CM | POA: Diagnosis not present

## 2014-10-13 DIAGNOSIS — R609 Edema, unspecified: Secondary | ICD-10-CM | POA: Diagnosis not present

## 2014-10-13 DIAGNOSIS — M7662 Achilles tendinitis, left leg: Secondary | ICD-10-CM

## 2014-10-13 MED ORDER — OXYCODONE-ACETAMINOPHEN 10-325 MG PO TABS: 1.0000 | ORAL_TABLET | ORAL | Status: AC | PRN

## 2014-10-14 NOTE — Progress Notes (Signed)
Subjective:     Patient ID: Kathryn Holt, female   DOB: 04/20/1966, 49 y.o.   MRN: [id number]  HPI patient presents with quite a bit of swelling in the left ankle and states that she was on her foot more and that she had surgery done several months ago on her left Achilles tendon by Dr. Ralene CorkSikora   Review of Systems     Objective:   Physical Exam Neurovascular status intact with negative Homans sign noted. The posterior incision site is healing well with wound edges well coapted and no drainage noted. Patient has moderate edema in the left forefoot and ankle not pitting in nature    Assessment:     Probable overuse symptom with edema which is been created left forefoot and ankle with well-healing surgical site posterior heel with muscle intact and Achilles tendon functioning well    Plan:     Applied Unna boot Ace bandage and continue surgical shoe usage to try to reduce inflammation present for routine visit or earlier if any issues should occur

## 2014-10-28 ENCOUNTER — Ambulatory Visit (INDEPENDENT_AMBULATORY_CARE_PROVIDER_SITE_OTHER): Payer: BLUE CROSS/BLUE SHIELD | Admitting: Podiatry

## 2014-10-28 ENCOUNTER — Encounter: Payer: Self-pay | Admitting: Podiatry

## 2014-10-28 ENCOUNTER — Ambulatory Visit (INDEPENDENT_AMBULATORY_CARE_PROVIDER_SITE_OTHER): Payer: BLUE CROSS/BLUE SHIELD

## 2014-10-28 VITALS — BP 142/86 | HR 69 | Resp 12

## 2014-10-28 DIAGNOSIS — M7662 Achilles tendinitis, left leg: Secondary | ICD-10-CM

## 2014-10-28 DIAGNOSIS — Z09 Encounter for follow-up examination after completed treatment for conditions other than malignant neoplasm: Secondary | ICD-10-CM | POA: Diagnosis not present

## 2014-10-31 NOTE — Progress Notes (Signed)
Patient ID: Kathryn Holt, female   DOB: 11/19/1965, 49 y.o.   MRN: [id number]  Subjective:  49 year old female presents the office today status post left foot Achilles Tenolysis and retrocalcaneal exosectomy preformed by Dr.Sikora on 08-10-14.  She said overall she does feels that she is doing better. She states that the wrap was applied appointment did help significantly and she is requesting another wrapping today. She does continue to wear a cam boot and she has tried to wear a regular shoe on that she does have pain. She's been performing some stretching exercises at home on intermittent basis. She denies any systemic complaints as fevers, chills, nausea, vomiting. Denies any calf pain, chest pain, shortness of breath. No other complaints at this time in no acute changes.  Objective: AAO 3, NAD DP/PT pulses palpable, CRT less than 3 seconds Protective sensation intact with Simms Weinstein monofilament Incision on the posterior aspect of the Achilles tendon is well coapted without any evidence of dehiscence and there is no swelling erythema, ascending cellulitis, fluctuance, crepitus, drainage, malodor, or any other clinical signs of infection.  There is mild tenderness on palpation along the distal acid the Achilles tendon. Kathryn Holt test is informed is negative and there is no defect noted in the Achilles tendon. No other areas of tenderness to bilateral lower extremities. There is mild edema overlying the area. No pain with calf compression, swelling, warmth, erythema. No open lesions or pre-ulcerative lesions identified.  Assessment: 49 year old female status post left Achilles tendon repair, retrocalcaneal exostectomy with improving pain  Plan: -X-rays were obtained and reviewed with the patient. -Treatment options were discussed the patient could alternatives, risks, complications. -Unna boot was applied -Discussed with patient she can start to transition out of the Cam Walker back into a  regular shoe. -Continue stretching activities. -A this time ID 1 that she would likely benefit from physical therapy in order to help transition out of the boot back into a regular shoe. Prescription was given for physical therapy today. -Follow-up in 3 weeks or sooner if any problems are to arise. Call with questions or concerns in the meantime.

## 2014-11-18 ENCOUNTER — Ambulatory Visit: Payer: BLUE CROSS/BLUE SHIELD | Admitting: Podiatry

## 2014-12-09 ENCOUNTER — Ambulatory Visit: Payer: BLUE CROSS/BLUE SHIELD | Admitting: Podiatry

## 2014-12-20 ENCOUNTER — Ambulatory Visit: Payer: BLUE CROSS/BLUE SHIELD | Admitting: Podiatry

## 2015-06-14 DIAGNOSIS — M79673 Pain in unspecified foot: Secondary | ICD-10-CM
